# Patient Record
Sex: Male | Born: 1935 | Hispanic: No
Health system: Southern US, Community
[De-identification: ages and names within clinical notes are randomized; demographics above are authoritative.]

---

## 2003-08-27 ENCOUNTER — Other Ambulatory Visit: Payer: Self-pay

## 2003-11-15 ENCOUNTER — Other Ambulatory Visit: Payer: Self-pay

## 2003-11-15 ENCOUNTER — Inpatient Hospital Stay: Payer: Self-pay | Admitting: Internal Medicine

## 2004-09-16 ENCOUNTER — Ambulatory Visit: Payer: Self-pay | Admitting: Internal Medicine

## 2004-11-12 ENCOUNTER — Ambulatory Visit: Payer: Self-pay

## 2005-01-23 ENCOUNTER — Ambulatory Visit: Payer: Self-pay | Admitting: Internal Medicine

## 2006-05-16 ENCOUNTER — Other Ambulatory Visit: Payer: Self-pay

## 2006-05-16 ENCOUNTER — Emergency Department: Payer: Self-pay | Admitting: Emergency Medicine

## 2006-11-29 ENCOUNTER — Emergency Department: Payer: Self-pay | Admitting: Emergency Medicine

## 2007-12-04 ENCOUNTER — Emergency Department: Payer: Self-pay | Admitting: Unknown Physician Specialty

## 2007-12-10 ENCOUNTER — Emergency Department: Payer: Self-pay | Admitting: Emergency Medicine

## 2008-02-12 ENCOUNTER — Inpatient Hospital Stay: Payer: Self-pay | Admitting: Internal Medicine

## 2008-04-24 ENCOUNTER — Inpatient Hospital Stay: Payer: Self-pay | Admitting: Internal Medicine

## 2008-05-11 ENCOUNTER — Emergency Department: Payer: Self-pay | Admitting: Emergency Medicine

## 2009-02-12 ENCOUNTER — Emergency Department: Payer: Self-pay | Admitting: Unknown Physician Specialty

## 2009-03-14 ENCOUNTER — Inpatient Hospital Stay: Payer: Self-pay | Admitting: Orthopedic Surgery

## 2009-03-14 ENCOUNTER — Ambulatory Visit: Payer: Self-pay | Admitting: Internal Medicine

## 2009-03-19 ENCOUNTER — Encounter: Payer: Self-pay | Admitting: Internal Medicine

## 2009-03-20 ENCOUNTER — Encounter: Payer: Self-pay | Admitting: Internal Medicine

## 2009-03-27 ENCOUNTER — Inpatient Hospital Stay: Payer: Self-pay | Admitting: Internal Medicine

## 2010-01-02 ENCOUNTER — Emergency Department: Payer: Self-pay | Admitting: Emergency Medicine

## 2010-01-08 ENCOUNTER — Inpatient Hospital Stay: Payer: Self-pay | Admitting: Internal Medicine

## 2010-06-06 ENCOUNTER — Ambulatory Visit: Payer: Self-pay | Admitting: Cardiology

## 2010-06-11 ENCOUNTER — Ambulatory Visit: Payer: Self-pay | Admitting: Cardiology

## 2010-06-15 ENCOUNTER — Inpatient Hospital Stay: Payer: Self-pay | Admitting: Internal Medicine

## 2010-06-27 ENCOUNTER — Inpatient Hospital Stay: Payer: Self-pay | Admitting: Family Medicine

## 2010-07-04 ENCOUNTER — Emergency Department: Payer: Self-pay | Admitting: Emergency Medicine

## 2011-01-27 IMAGING — CR DG CHEST 1V PORT
1 series · 1 of 1 positions shown · non-contrast
Comparison: none

REASON FOR EXAM: low sats
COMMENTS:

[view not recorded]
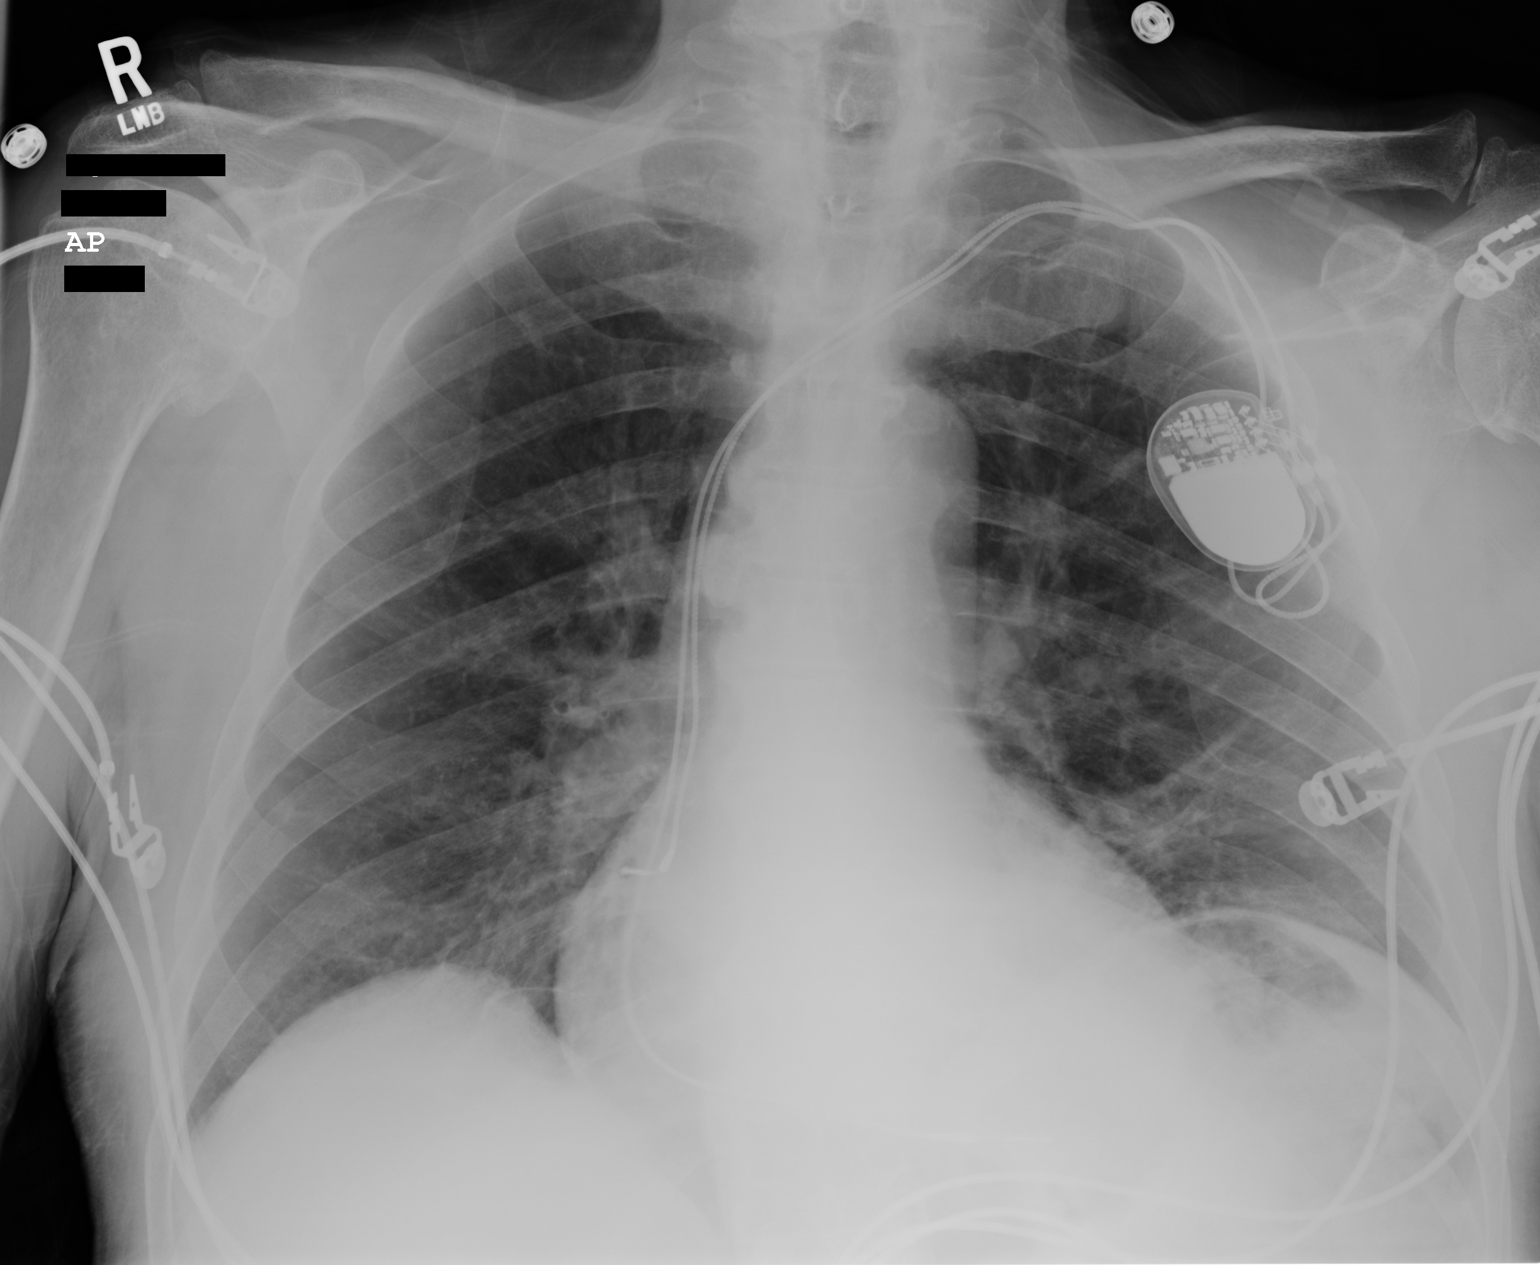

[1 of 1 positions shown; findings below may reference images not displayed]

PROCEDURE:     DXR - DXR PORTABLE CHEST SINGLE VIEW  - April 24, 2008  [DATE]

RESULT:     Comparison is made to the exam dated 02/18/2008. There is a
left-sided pacemaker present. The cardiac silhouette is at the upper limits
of normal. There is basilar fibrosis or atelectasis on the left. Infiltrate
is felt to be less likely an option. There is no edema, effusion or
pneumothorax.
IMPRESSION: Left lung base atelectasis versus fibrosis. Left-sided
pacemaker present.

## 2011-03-21 ENCOUNTER — Ambulatory Visit: Payer: Self-pay | Admitting: Internal Medicine

## 2011-03-29 ENCOUNTER — Emergency Department: Payer: Self-pay | Admitting: Internal Medicine

## 2011-04-09 ENCOUNTER — Inpatient Hospital Stay: Payer: Self-pay | Admitting: Family Medicine

## 2011-04-09 LAB — CBC
HCT: 34.2 % — ABNORMAL LOW (ref 40.0–52.0)
HGB: 11.1 g/dL — ABNORMAL LOW (ref 13.0–18.0)
MCH: 32.7 pg (ref 26.0–34.0)
MCHC: 32.5 g/dL (ref 32.0–36.0)
MCV: 101 fL — ABNORMAL HIGH (ref 80–100)
Platelet: 140 10*3/uL — ABNORMAL LOW (ref 150–440)
RBC: 3.4 10*6/uL — ABNORMAL LOW (ref 4.40–5.90)

## 2011-04-09 LAB — COMPREHENSIVE METABOLIC PANEL
Albumin: 3.1 g/dL — ABNORMAL LOW (ref 3.4–5.0)
Anion Gap: 10 (ref 7–16)
Bilirubin,Total: 0.4 mg/dL (ref 0.2–1.0)
Calcium, Total: 8.4 mg/dL — ABNORMAL LOW (ref 8.5–10.1)
Chloride: 101 mmol/L (ref 98–107)
Co2: 38 mmol/L — ABNORMAL HIGH (ref 21–32)
Creatinine: 0.72 mg/dL (ref 0.60–1.30)
EGFR (African American): >60
Glucose: 114 mg/dL — ABNORMAL HIGH (ref 65–99)
Osmolality: 297 (ref 275–301)
Potassium: 3.3 mmol/L — ABNORMAL LOW (ref 3.5–5.1)
Sodium: 149 mmol/L — ABNORMAL HIGH (ref 136–145)
Total Protein: 6.2 g/dL — ABNORMAL LOW (ref 6.4–8.2)

## 2011-04-09 LAB — PROTIME-INR
INR: 1.1
Prothrombin Time: 14.7 secs (ref 11.5–14.7)

## 2011-04-09 LAB — TROPONIN I: Troponin-I: 0.15 ng/mL — ABNORMAL HIGH

## 2011-04-10 LAB — CBC WITH DIFFERENTIAL/PLATELET
Basophil #: 0 10*3/uL (ref 0.0–0.1)
Basophil %: 0.5 %
Eosinophil #: 0.1 10*3/uL (ref 0.0–0.7)
Eosinophil %: 2.1 %
HGB: 12 g/dL — ABNORMAL LOW (ref 13.0–18.0)
Lymphocyte %: 11.6 %
MCH: 32.8 pg (ref 26.0–34.0)
MCV: 101 fL — ABNORMAL HIGH (ref 80–100)
Monocyte %: 5.7 %
Neutrophil #: 4.1 10*3/uL (ref 1.4–6.5)
Neutrophil %: 80.1 %
Platelet: 147 10*3/uL — ABNORMAL LOW (ref 150–440)
RBC: 3.67 10*6/uL — ABNORMAL LOW (ref 4.40–5.90)
WBC: 5.2 10*3/uL (ref 3.8–10.6)

## 2011-04-10 LAB — HEMOGLOBIN A1C: Hemoglobin A1C: 6.2 % (ref 4.2–6.3)

## 2011-04-10 LAB — TROPONIN I
Troponin-I: 0.18 ng/mL — ABNORMAL HIGH
Troponin-I: 0.16 ng/mL — ABNORMAL HIGH

## 2011-04-10 LAB — CK TOTAL AND CKMB (NOT AT ARMC)
CK, Total: 135 U/L (ref 35–232)
CK, Total: 101 U/L (ref 35–232)
CK-MB: 3.8 ng/mL — ABNORMAL HIGH (ref 0.5–3.6)
CK-MB: 3.1 ng/mL (ref 0.5–3.6)

## 2011-04-10 LAB — URINALYSIS, COMPLETE
Bilirubin,UR: NEGATIVE
Glucose,UR: NEGATIVE mg/dL (ref 0–75)
Hyaline Cast: 3
Nitrite: NEGATIVE
Ph: 5 (ref 4.5–8.0)
Protein: 30
RBC,UR: 8 /HPF (ref 0–5)
Specific Gravity: 1.017 (ref 1.003–1.030)
Squamous Epithelial: 1
WBC UR: 260 /HPF (ref 0–5)

## 2011-04-10 LAB — BASIC METABOLIC PANEL
Anion Gap: 12 (ref 7–16)
Calcium, Total: 8.1 mg/dL — ABNORMAL LOW (ref 8.5–10.1)
Chloride: 104 mmol/L (ref 98–107)
Co2: 33 mmol/L — ABNORMAL HIGH (ref 21–32)
Creatinine: 0.71 mg/dL (ref 0.60–1.30)
EGFR (African American): >60
Potassium: 2.8 mmol/L — ABNORMAL LOW (ref 3.5–5.1)

## 2011-04-10 LAB — LIPID PANEL
HDL Cholesterol: 52 mg/dL (ref 40–60)
Ldl Cholesterol, Calc: 75 mg/dL (ref 0–100)
VLDL Cholesterol, Calc: 9 mg/dL (ref 5–40)

## 2011-04-10 LAB — TSH: Thyroid Stimulating Horm: 2.83 u[IU]/mL

## 2011-04-10 LAB — MAGNESIUM: Magnesium: 1.6 mg/dL — ABNORMAL LOW

## 2011-04-11 LAB — BASIC METABOLIC PANEL
Anion Gap: 11 (ref 7–16)
BUN: 17 mg/dL (ref 7–18)
Creatinine: 0.87 mg/dL (ref 0.60–1.30)
EGFR (African American): >60
EGFR (Non-African Amer.): >60
Glucose: 104 mg/dL — ABNORMAL HIGH (ref 65–99)
Osmolality: 296 (ref 275–301)

## 2011-04-12 ENCOUNTER — Inpatient Hospital Stay: Payer: Self-pay | Admitting: Family Medicine

## 2011-04-12 DIAGNOSIS — R9431 Abnormal electrocardiogram [ECG] [EKG]: Secondary | ICD-10-CM

## 2011-04-12 LAB — COMPREHENSIVE METABOLIC PANEL
Alkaline Phosphatase: 115 U/L (ref 50–136)
Bilirubin,Total: 0.4 mg/dL (ref 0.2–1.0)
Chloride: 106 mmol/L (ref 98–107)
Glucose: 160 mg/dL — ABNORMAL HIGH (ref 65–99)
SGPT (ALT): 37 U/L
Sodium: 145 mmol/L (ref 136–145)
Total Protein: 5.6 g/dL — ABNORMAL LOW (ref 6.4–8.2)

## 2011-04-12 LAB — CBC WITH DIFFERENTIAL/PLATELET
Basophil %: 0.3 %
Eosinophil %: 2.2 %
HCT: 36.3 % — ABNORMAL LOW (ref 40.0–52.0)
HGB: 11.8 g/dL — ABNORMAL LOW (ref 13.0–18.0)
Lymphocyte #: 0.6 10*3/uL — ABNORMAL LOW (ref 1.0–3.6)
Lymphocyte %: 12.1 %
MCHC: 32.6 g/dL (ref 32.0–36.0)
MCV: 101 fL — ABNORMAL HIGH (ref 80–100)
Monocyte #: 0.2 10*3/uL (ref 0.0–0.7)
Monocyte %: 3.9 %
Neutrophil #: 4.3 10*3/uL (ref 1.4–6.5)
WBC: 5.3 10*3/uL (ref 3.8–10.6)

## 2011-04-12 LAB — URINALYSIS, COMPLETE
Glucose,UR: NEGATIVE mg/dL (ref 0–75)
Nitrite: NEGATIVE
Protein: NEGATIVE
RBC,UR: 37 /HPF (ref 0–5)
Squamous Epithelial: NONE SEEN
WBC UR: 558 /HPF (ref 0–5)

## 2011-04-12 LAB — PROTIME-INR
INR: 1.1
Prothrombin Time: 14.2 secs (ref 11.5–14.7)

## 2011-04-12 LAB — TROPONIN I: Troponin-I: 0.12 ng/mL — ABNORMAL HIGH

## 2011-04-13 LAB — CBC WITH DIFFERENTIAL/PLATELET
Basophil %: 0 %
Eosinophil #: 0 10*3/uL (ref 0.0–0.7)
Eosinophil %: 0 %
HGB: 11.5 g/dL — ABNORMAL LOW (ref 13.0–18.0)
Lymphocyte #: 0.2 10*3/uL — ABNORMAL LOW (ref 1.0–3.6)
Lymphocyte %: 4.6 %
MCH: 33.2 pg (ref 26.0–34.0)
MCHC: 33.3 g/dL (ref 32.0–36.0)
MCV: 100 fL (ref 80–100)
Monocyte #: 0.1 10*3/uL (ref 0.0–0.7)
Neutrophil %: 93.9 %
Platelet: 143 10*3/uL — ABNORMAL LOW (ref 150–440)
RBC: 3.47 10*6/uL — ABNORMAL LOW (ref 4.40–5.90)

## 2011-04-13 LAB — BASIC METABOLIC PANEL
Anion Gap: 15 (ref 7–16)
Chloride: 108 mmol/L — ABNORMAL HIGH (ref 98–107)
Co2: 27 mmol/L (ref 21–32)
Creatinine: 0.93 mg/dL (ref 0.60–1.30)
EGFR (African American): >60
Osmolality: 302 (ref 275–301)
Sodium: 150 mmol/L — ABNORMAL HIGH (ref 136–145)

## 2011-04-14 LAB — CBC WITH DIFFERENTIAL/PLATELET
Basophil #: 0 10*3/uL (ref 0.0–0.1)
Basophil %: 0.1 %
Eosinophil #: 0 10*3/uL (ref 0.0–0.7)
HCT: 40.4 % (ref 40.0–52.0)
HGB: 13.4 g/dL (ref 13.0–18.0)
Lymphocyte %: 3.6 %
MCH: 33 pg (ref 26.0–34.0)
MCHC: 33.2 g/dL (ref 32.0–36.0)
Monocyte #: 0.4 10*3/uL (ref 0.0–0.7)
Monocyte %: 3.4 %
Neutrophil %: 92.9 %
RDW: 14.8 % — ABNORMAL HIGH (ref 11.5–14.5)
WBC: 11.4 10*3/uL — ABNORMAL HIGH (ref 3.8–10.6)

## 2011-04-14 LAB — BASIC METABOLIC PANEL
Anion Gap: 15 (ref 7–16)
BUN: 28 mg/dL — ABNORMAL HIGH (ref 7–18)
Calcium, Total: 8.3 mg/dL — ABNORMAL LOW (ref 8.5–10.1)
Co2: 27 mmol/L (ref 21–32)
EGFR (African American): >60
EGFR (Non-African Amer.): >60
Glucose: 178 mg/dL — ABNORMAL HIGH (ref 65–99)
Potassium: 3.3 mmol/L — ABNORMAL LOW (ref 3.5–5.1)
Sodium: 148 mmol/L — ABNORMAL HIGH (ref 136–145)

## 2011-04-14 LAB — DRUG SCREEN, URINE
Amphetamines, Ur Screen: NEGATIVE (ref ?–1000)
Barbiturates, Ur Screen: NEGATIVE (ref ?–200)
Benzodiazepine, Ur Scrn: POSITIVE (ref ?–200)
Cannabinoid 50 Ng, Ur ~~LOC~~: NEGATIVE (ref ?–50)
MDMA (Ecstasy)Ur Screen: NEGATIVE (ref ?–500)
Methadone, Ur Screen: NEGATIVE (ref ?–300)
Opiate, Ur Screen: NEGATIVE (ref ?–300)
Tricyclic, Ur Screen: NEGATIVE (ref ?–1000)

## 2011-04-15 LAB — MAGNESIUM: Magnesium: 1.9 mg/dL

## 2011-04-16 LAB — CBC WITH DIFFERENTIAL/PLATELET
Basophil #: 0 10*3/uL (ref 0.0–0.1)
Eosinophil #: 0 10*3/uL (ref 0.0–0.7)
Eosinophil %: 0 %
HGB: 11.9 g/dL — ABNORMAL LOW (ref 13.0–18.0)
Lymphocyte #: 0.6 10*3/uL — ABNORMAL LOW (ref 1.0–3.6)
Lymphocyte %: 8 %
MCH: 33 pg (ref 26.0–34.0)
MCV: 101 fL — ABNORMAL HIGH (ref 80–100)
Neutrophil %: 85.7 %
Platelet: 136 10*3/uL — ABNORMAL LOW (ref 150–440)
RBC: 3.61 10*6/uL — ABNORMAL LOW (ref 4.40–5.90)
RDW: 15.2 % — ABNORMAL HIGH (ref 11.5–14.5)
WBC: 6.9 10*3/uL (ref 3.8–10.6)

## 2011-04-16 LAB — BASIC METABOLIC PANEL
Anion Gap: 10 (ref 7–16)
BUN: 24 mg/dL — ABNORMAL HIGH (ref 7–18)
Calcium, Total: 8.1 mg/dL — ABNORMAL LOW (ref 8.5–10.1)
Co2: 28 mmol/L (ref 21–32)
Creatinine: 0.85 mg/dL (ref 0.60–1.30)
EGFR (African American): >60
EGFR (Non-African Amer.): >60
Glucose: 90 mg/dL (ref 65–99)
Osmolality: 303 (ref 275–301)
Sodium: 151 mmol/L — ABNORMAL HIGH (ref 136–145)

## 2011-04-17 LAB — CULTURE, BLOOD (SINGLE)

## 2011-04-18 ENCOUNTER — Ambulatory Visit: Payer: Self-pay | Admitting: Internal Medicine

## 2011-05-19 DEATH — deceased

## 2011-11-17 IMAGING — CR DG CHEST 1V PORT
1 series · 2 of 2 positions shown · non-contrast
Comparison: none

REASON FOR EXAM: sob
COMMENTS:

[Series 1: view not recorded · 0.17mm/px · 2 of 2 slices shown]
[im 1/2]
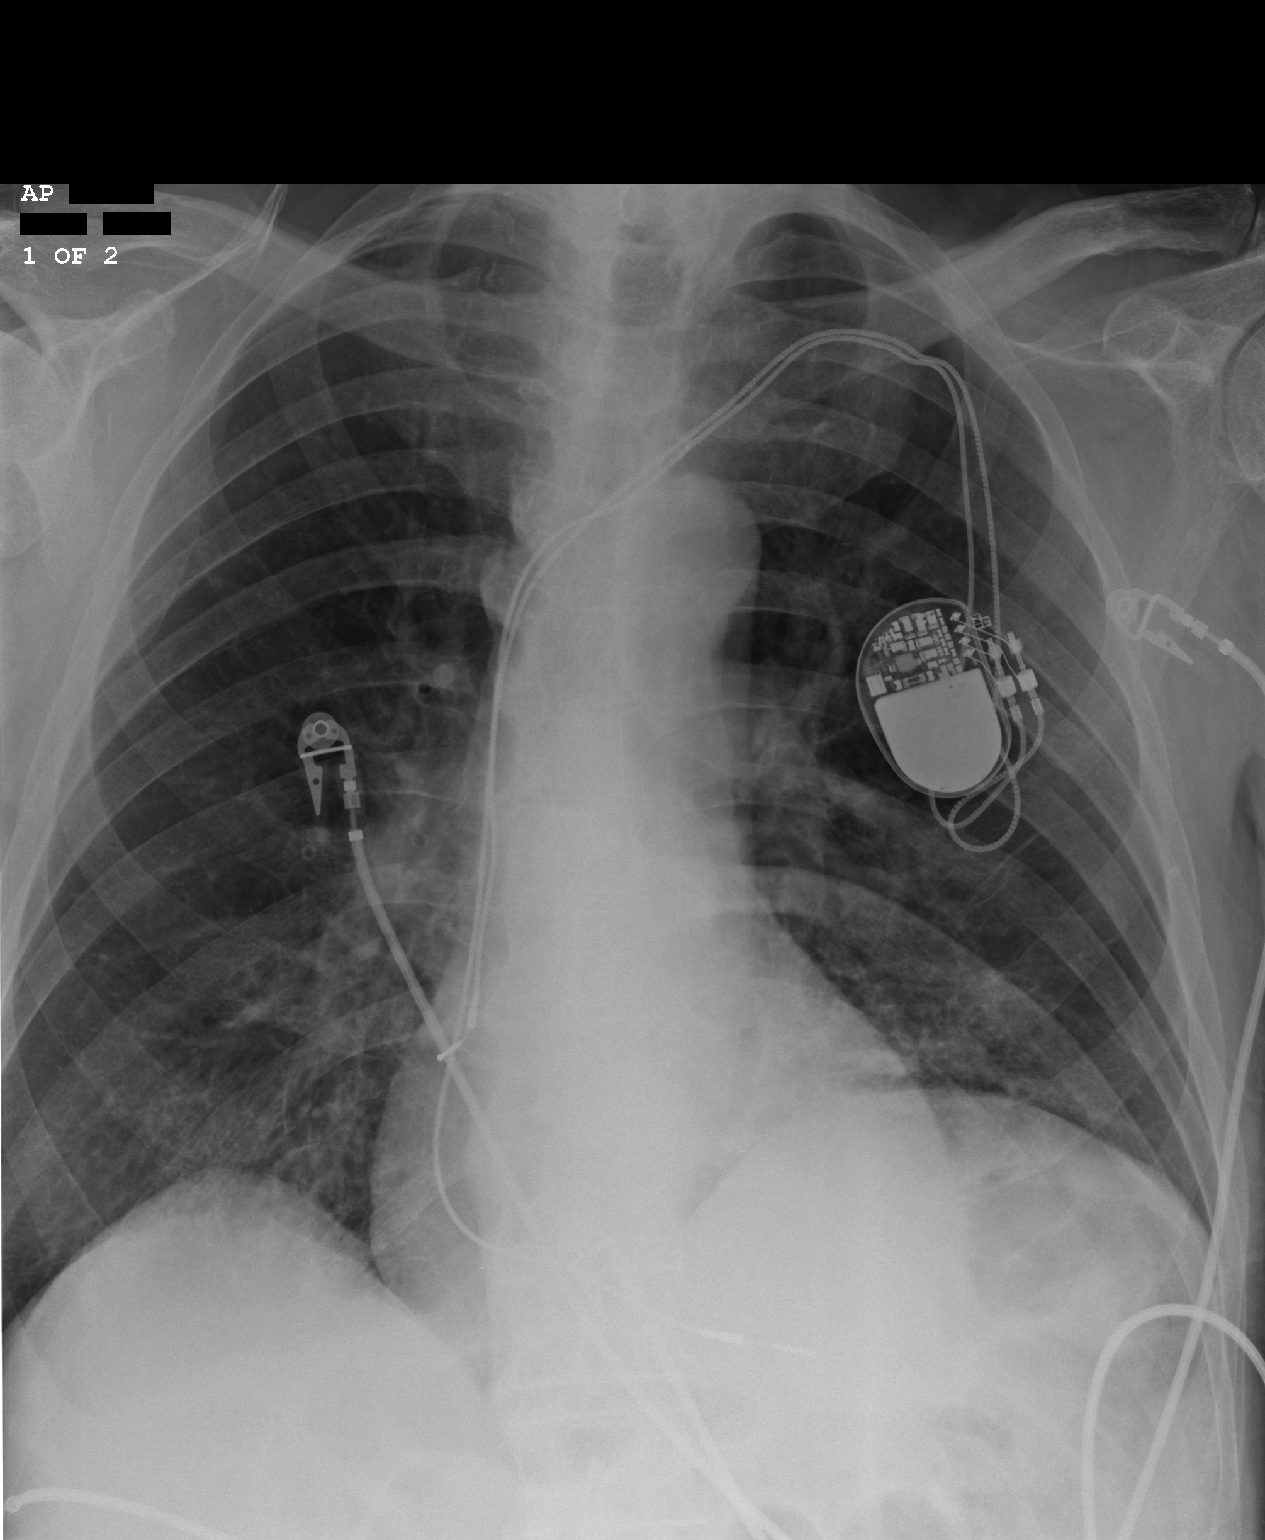
[im 2/2]
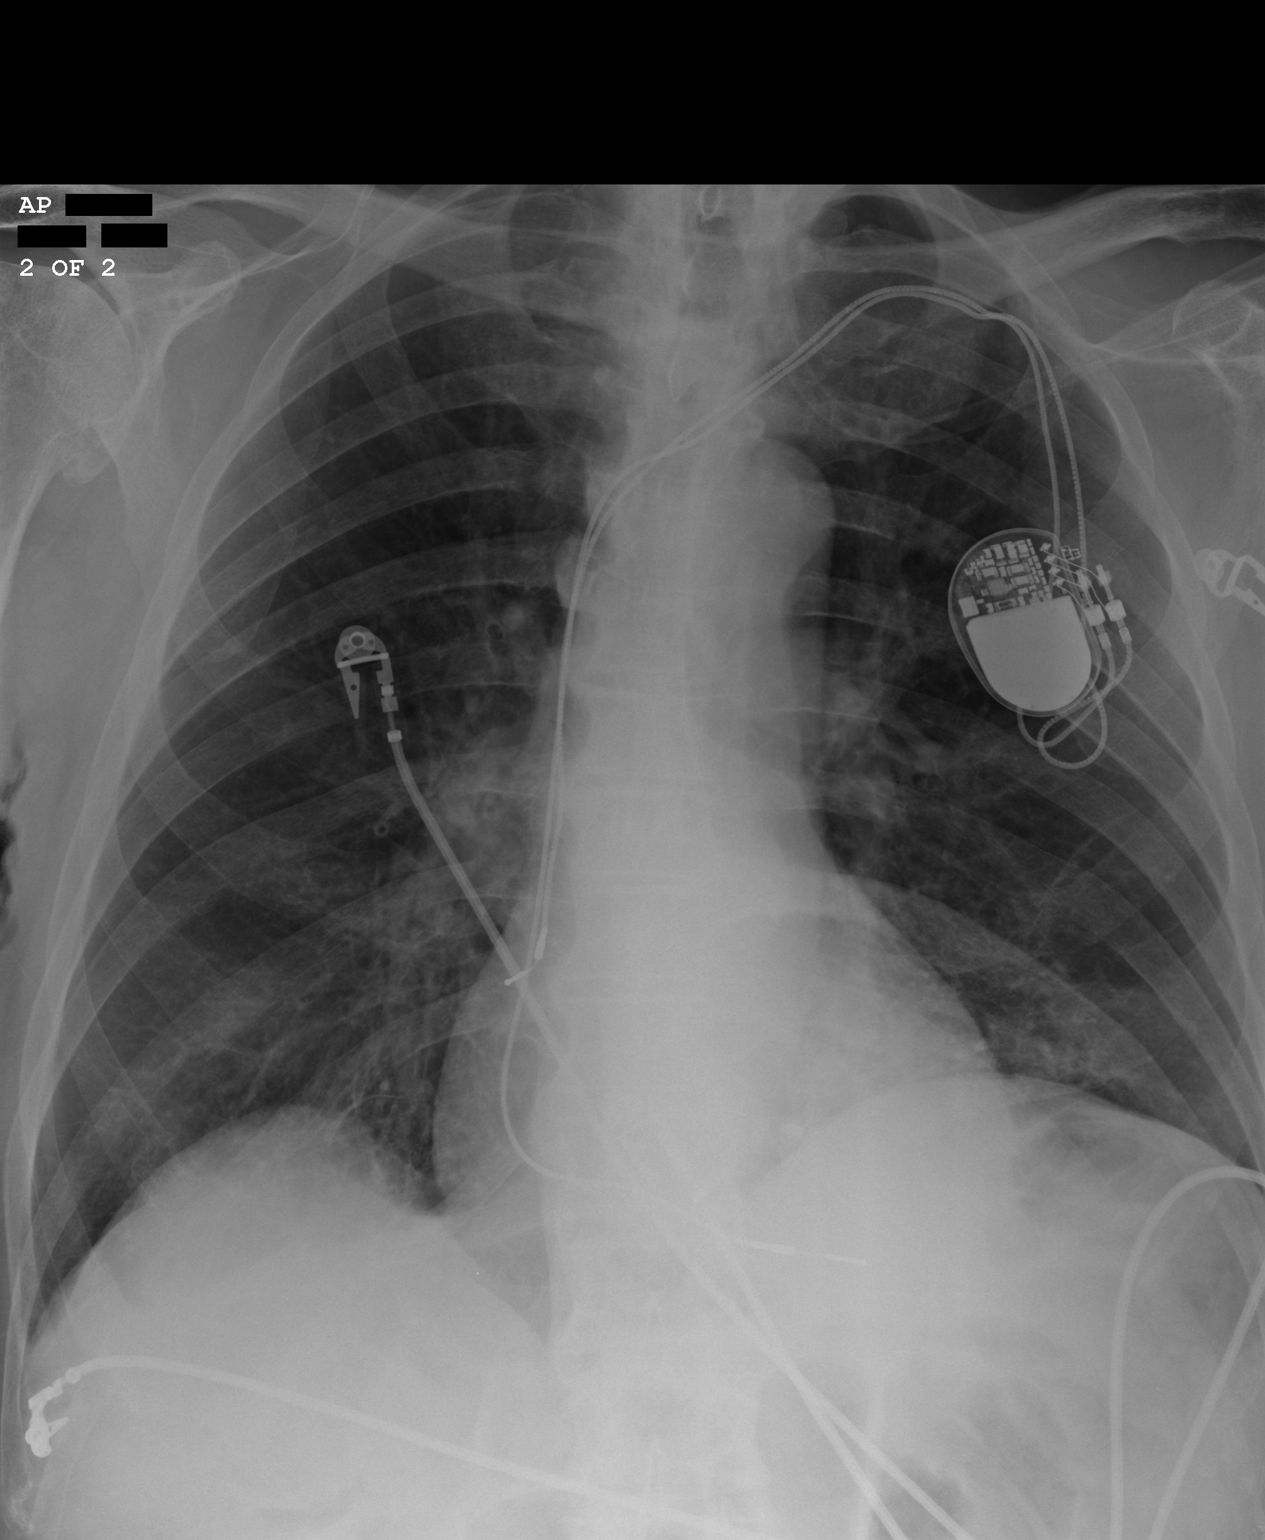

[2 of 2 positions shown; findings below may reference images not displayed]

PROCEDURE:     DXR - DXR PORTABLE CHEST SINGLE VIEW  - February 12, 2009  [DATE]

RESULT:     Comparison is made to the study of 04/24/2008.

There is a left-sided pacemaker present. The lungs are hyperinflated
consistent with COPD. There is no definite infiltrate, edema, effusion or
pneumothorax. Some minimal left lung base atelectasis appears present.
IMPRESSION: 1.     Pacemaker present.
2.     Minimal basilar atelectasis.

## 2014-01-14 IMAGING — CT CT CHEST W/ CM
1 series · 14 of 33 positions shown, 18 images · IV contrast (APPLIED)
Comparison: none

REASON FOR EXAM: acute respiratory failure R/O PE
COMMENTS:   LMP: (Male)

PROCEDURE:     CT  - CT CHEST WITH CONTRAST  - April 12, 2011  [DATE]
RESULT:     Comparison: 03/27/2009
TECHNIQUE: Multiple thin section axial images were obtained from the lung
apices to the upper abdomen following 75 ml Isovue 370 intravenous contrast,
according to the PE protocol. These images were also reviewed on a Siemens
multiplanar work station.

[Series 4: soft tissue · axial · 0.85mm/px · z∈[-437,-116]mm · 14 of 127 slices shown, 18 images]
[im 10/127  mediastinal]
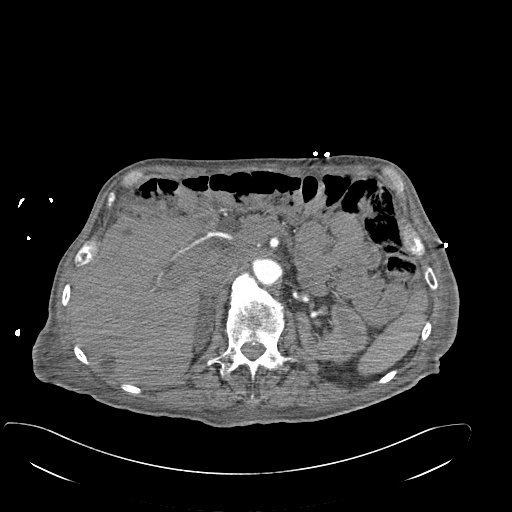
[im 10/127  lung]
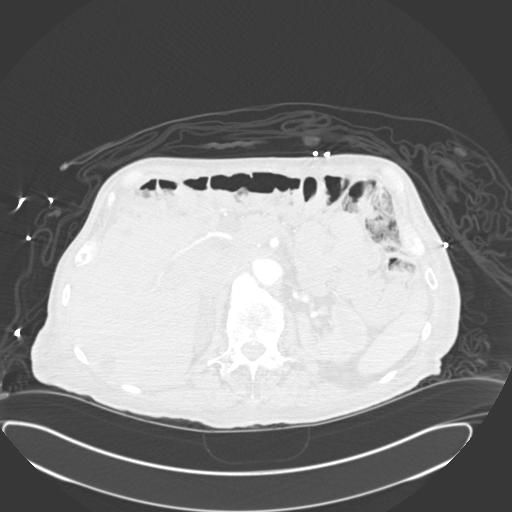
[im 19/127  lung]
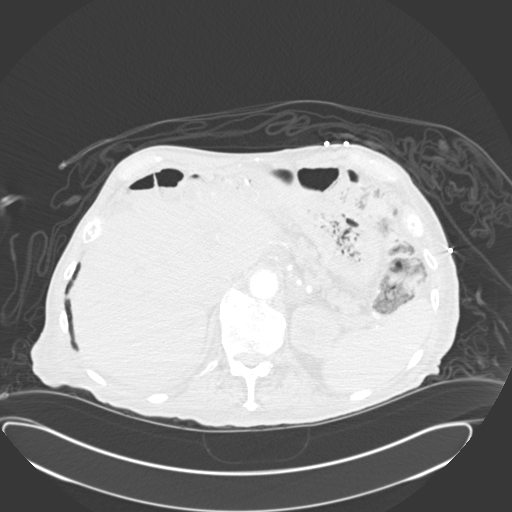
[im 26/127  lung]
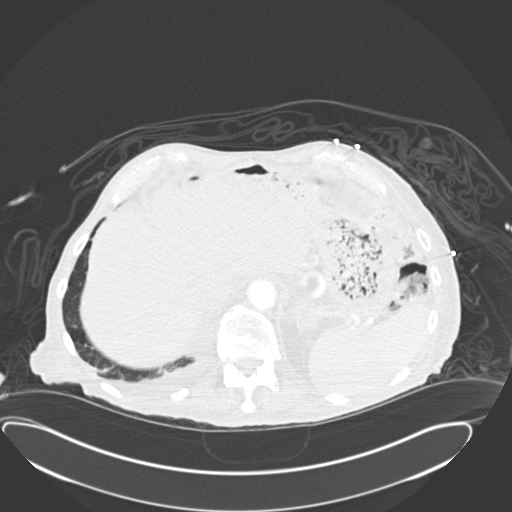
[im 33/127  lung]
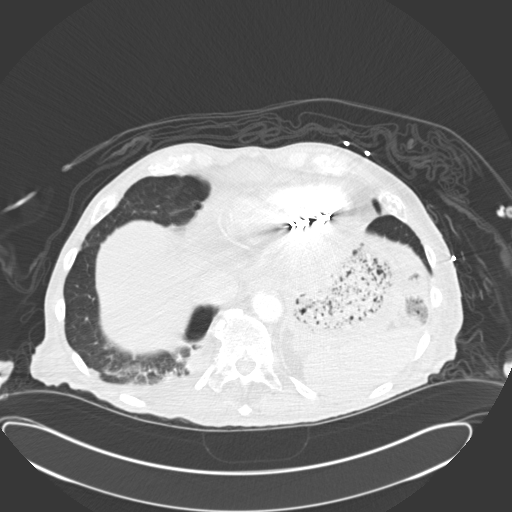
[im 43/127  mediastinal]
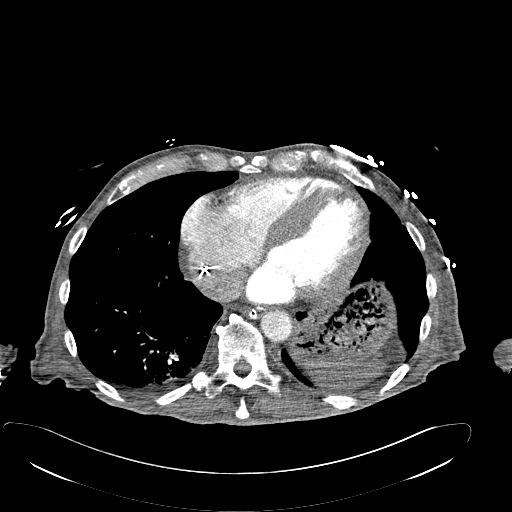
[im 43/127  lung]
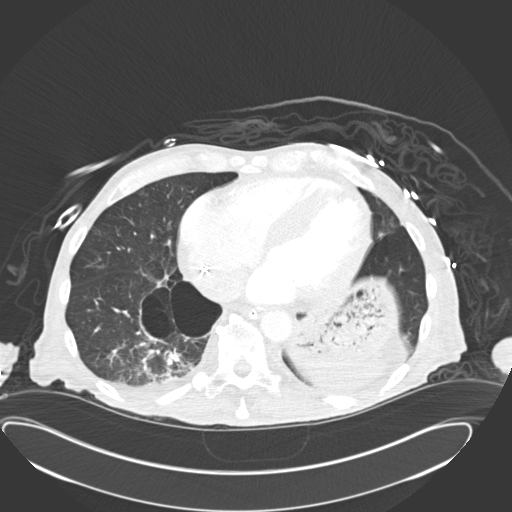
[im 52/127  lung]
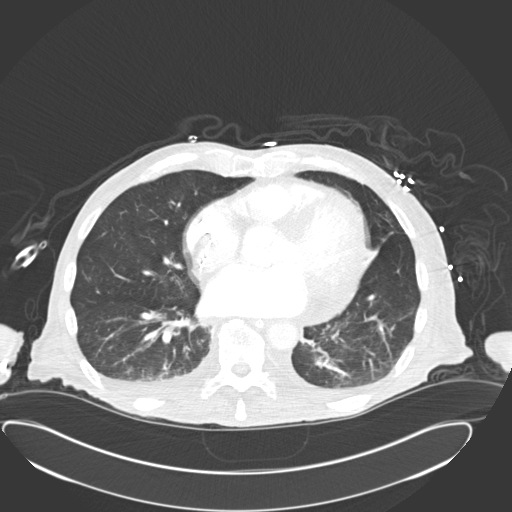
[im 61/127  lung]
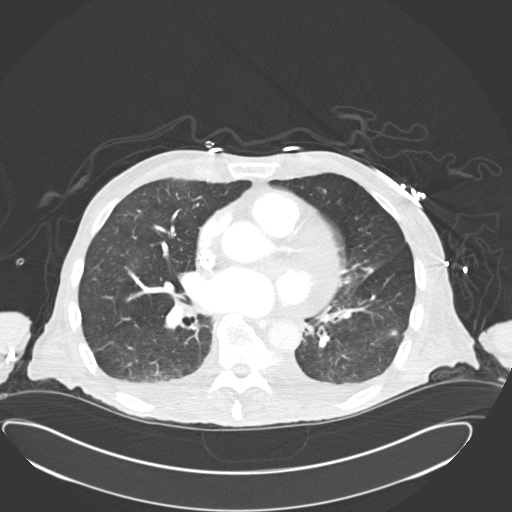
[im 67/127  lung]
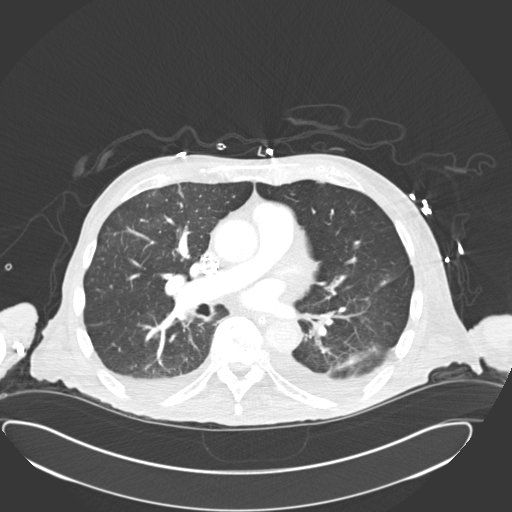
[im 75/127  mediastinal]
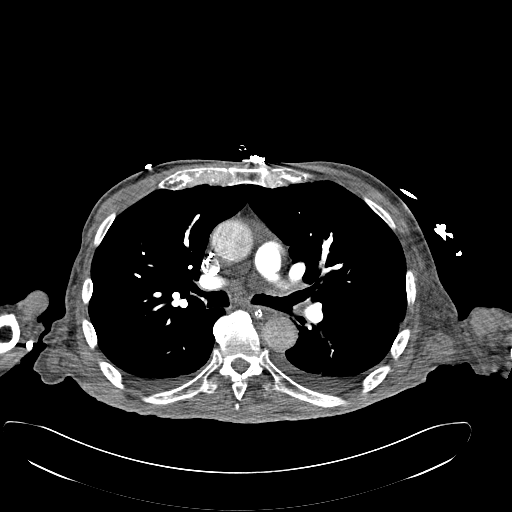
[im 75/127  lung]
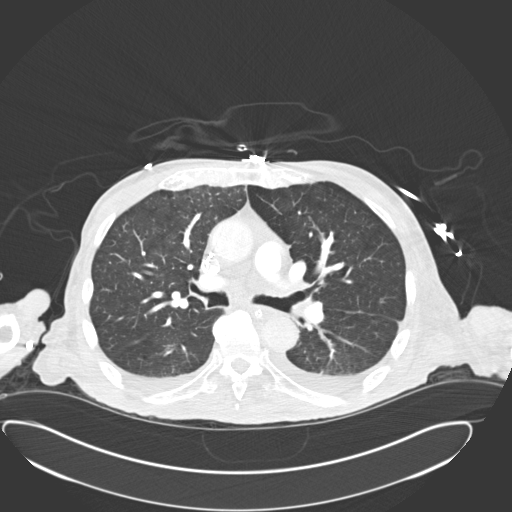
[im 85/127  lung]
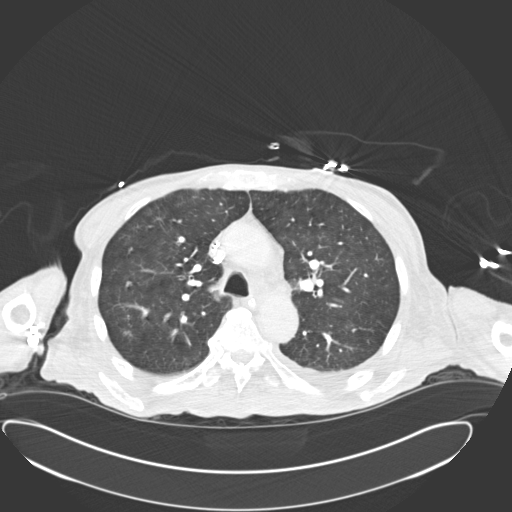
[im 94/127  lung]
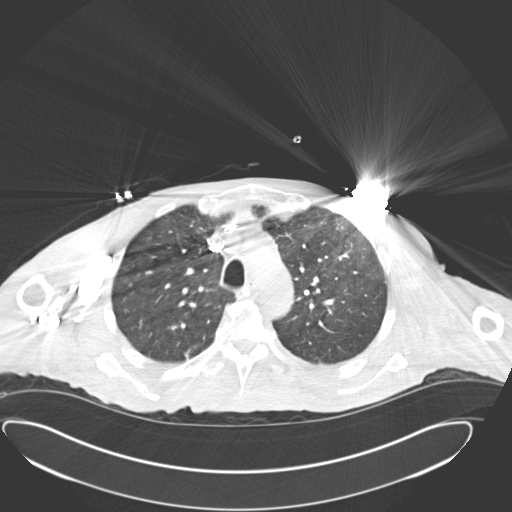
[im 101/127  lung]
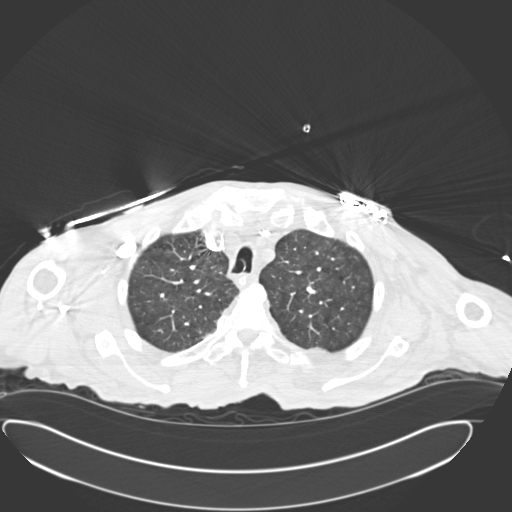
[im 108/127  mediastinal]
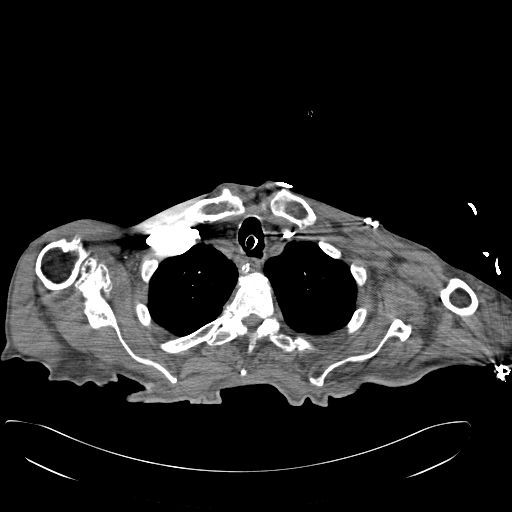
[im 108/127  lung]
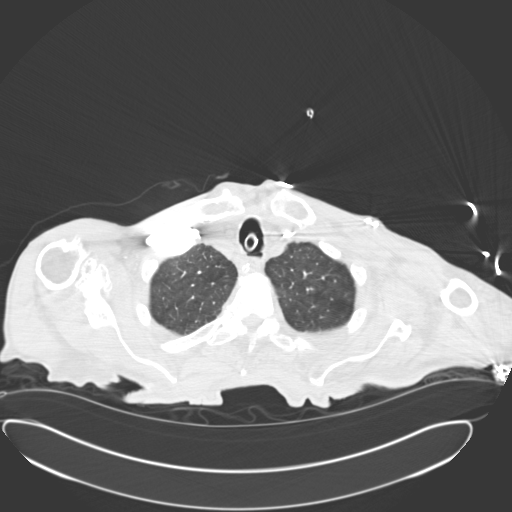
[im 117/127  lung]
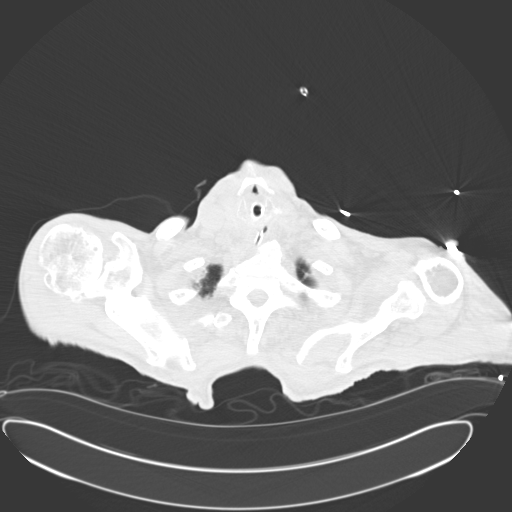

[14 of 33 positions shown; findings below may reference images not displayed]

FINDINGS: The endotracheal tube terminates in the superior intrathoracic trachea. No
mediastinal, hilar, or axillary lymphadenopathy. Calcifications are seen in
the coronary arteries. Small low-attenuation focus in the right hepatic lobe
is too small to characterize. Low-attenuation lesion in the posterior right
hepatic lobe likely represents a cyst. A rounded low-attenuation lesion in
the inferior right hepatic lobe is incompletely visualized on this study,
but consistent with a cyst on the prior the study.

There is a small aneurysm of the celiac artery measuring 12 mm in diameter.
This is similar to prior. The thoracic aorta is normal in caliber. No
pulmonary embolus identified. Evaluation of the segmental pulmonary arteries
is limited by respiratory motion.

There is a small bulla at the right lung base. There is a trace right
pleural effusion. Mild basilar dependent opacities are likely secondary to
atelectasis. There is a subpleural nodular density in the left upper lobe
which measures 1.2 x 0.7 cm. There are mild groundglass opacities in the
upper lobes which are nonspecific but may be related to pulmonary edema.
There is a 4 mm nodule at the left upper lobe, image 21.

No aggressive lytic or sclerotic osseous lesions are identified.
IMPRESSION: 1. No pulmonary embolus identified. Evaluation of the segmental pulmonary
arteries is limited.
2. Subtle groundglass opacities in the upper lobes are nonspecific. These
may be secondary to pulmonary edema. A nonspecific alveolitis is a
differential consideration.
3. Indeterminate subpleural nodular density in the left upper lobe. A
followup noncontrast chest CT is recommended in 3 months.
4. Indeterminate 4 mm nodule in the left upper lobe. Recommend attention on
the aforementioned followup chest CT.
5. Small, 12 mm aneurysm of the celiac artery, similar to prior.

## 2014-06-11 NOTE — Consult Note (Signed)
Present Illness 81 year old male with known cardiovascular disease with chronic atrial fibrillation with sick sinus syndrome status post pacemaker placement with recent pacemaker interrogation showing no evidence of need in reprogramming, or other rhythm disturbances.  The patient also has diastolic dysfunction, congestive heart failure treated with Lasix.  The patient has hypertension but has had an episode of dizziness and somewhat hypotension on arrival.  He currently has not had any f atypical type dizziness.  The patient also has diabetes mellitus which could be affecting some of his unsteadiness as well.  The patient has had an elevated troponi.  The patient also has an EKG today showing atrial fibrillation with controlled ventricular rate with pacemaker, ventricular rate.  The patient has had no further significant symptoms an  Family history No family members with early onset of cardiovascular disease  Social history Patient currently denies alcohol or tobacco use   Physical Exam:   GEN WD    HEENT pink conjunctivae    NECK No masses    RESP normal resp effort  crackles    CARD Regular rate and rhythm  Murmur    Murmur Systolic    Systolic Murmur axilla    ABD denies tenderness  soft    LYMPH negative neck    EXTR negative cyanosis/clubbing    SKIN No rashes    NEURO cranial nerves intact    PSYCH alert   Review of Systems:   Subjective/Chief Complaint I got dizzy    Review of Systems: All other systems were reviewed and found to be negative    Medications/Allergies Reviewed Medications/Allergies reviewed     PUD - Peptic Ulcer Disease:    BPH - Benign Prostatic Hypertrophy:    Hard of Hearing:    Kidney Stones:    Coronary Artery Disease:    Cataracts:    Ulcers, Gastric:    Hypothyroidism:    COPD:    Asthma:    Pacemaker:    ORIF of the left hip: 15-Mar-2009   stomach - related to gastic ulcers:    Hernia Repair:   Home  Medications: Medication Instructions Status  Proventil HFA 90 mcg/inh inhalation aerosol with adapter 1  inhaled every 4 hours as needed   Active  carbamazepine 200 mg oral tablet 1  orally 2 times a day  Active  ferrous sulfate 325 mg (65 mg elemental iron) oral tablet 1  orally 2 times a day  Active  fludrocortisone 0.1 mg oral tablet 1  orally once a day  Active  Levothroid 112 mcg (0.112 mg) oral tablet 1  orally once a day  Active  acetaminophen 650 mg oral tablet, extended release 1  orally every 6 hours as needed   Active  Lasix 20 mg oral tablet 1 tab(s) orally once a day Active  paroxetine 20 mg oral tablet 1 tab(s) orally once a day Active  Advair Diskus 250 mcg-50 mcg inhalation powder 1 puff(s) inhaled 2 times a day Active  Spiriva 18 mcg inhalation capsule 1 each inhaled once a day Active   Routine Hem:  20-Feb-13 19:17    WBC (CBC) 5.7   RBC (CBC) 3.40   Hemoglobin (CBC) 11.1   Hematocrit (CBC) 34.2   Platelet Count (CBC) 140   MCV 101   MCH 32.7   MCHC 32.5   RDW 15.1  Routine Chem:  20-Feb-13 19:17    Glucose, Serum 114   BUN 14   Creatinine (comp) 0.72   Sodium, Serum  149   Potassium, Serum 3.3   Chloride, Serum 101   CO2, Serum 38   Calcium (Total), Serum 8.4  Hepatic:  20-Feb-13 19:17    Bilirubin, Total 0.4   Alkaline Phosphatase 132   SGPT (ALT) 30   SGOT (AST) 39   Total Protein, Serum 6.2   Albumin, Serum 3.1  Routine Chem:  20-Feb-13 19:17    Osmolality (calc) 297   eGFR (African American) >60   eGFR (Non-African American) >60   Anion Gap 10  Cardiac:  20-Feb-13 19:17    Troponin I 0.15  Routine Coag:  20-Feb-13 19:17    Activated PTT (APTT) 32.4   Prothrombin 14.7   INR 1.1  Cardiac:  20-Feb-13 19:17    CK, Total 135   CPK-MB, Serum 3.8  Routine UA:  20-Feb-13 23:52    Color (UA) Amber   Clarity (UA) Cloudy   Glucose (UA) Negative   Bilirubin (UA) Negative   Ketones (UA) Negative   Specific Gravity (UA) 1.017   Blood (UA) 1+    pH (UA) 5.0   Protein (UA) 30 mg/dL   Nitrite (UA) Negative   Leukocyte Esterase (UA) 3+   RBC (UA) 8 /HPF   WBC (UA) 260 /HPF   Bacteria (UA) 3+   Epithelial Cells (UA) 1 /HPF   WBC Clump (UA) PRESENT   Hyaline Cast (UA) 3 /LPF  Routine Hem:  21-Feb-13 03:28    WBC (CBC) 5.2   RBC (CBC) 3.67   Hemoglobin (CBC) 12.0   Hematocrit (CBC) 36.9   Platelet Count (CBC) 147   MCV 101   MCH 32.8   MCHC 32.6   RDW 14.8  Routine Chem:  21-Feb-13 03:28    Glucose, Serum 101   BUN 15   Creatinine (comp) 0.71   Sodium, Serum 149   Potassium, Serum 2.8   Chloride, Serum 104   CO2, Serum 33   Calcium (Total), Serum 8.1   Osmolality (calc) 297   eGFR (African American) >60   eGFR (Non-African American) >60   Anion Gap 12  Cardiac:  21-Feb-13 03:28    Troponin I 0.18   CK, Total 89   CPK-MB, Serum 3.1  Routine Hem:  21-Feb-13 03:28    Neutrophil % 80.1   Lymphocyte % 11.6   Monocyte % 5.7   Eosinophil % 2.1   Basophil % 0.5   Neutrophil # 4.1   Lymphocyte # 0.6   Monocyte # 0.3   Eosinophil # 0.1   Basophil # 0.0  Routine Chem:  21-Feb-13 03:28    Magnesium, Serum 1.6   Hemoglobin A1c ([facility]) 6.2   Cholesterol, Serum 136   Triglycerides, Serum 45   HDL ([facility]) 52   VLDL Cholesterol Calculated 9   LDL Cholesterol Calculated 75  Thyroid:  21-Feb-13 03:28    Thyroid Stimulating Hormone 2.83   EKG:   EKG Interp. by me    Interpretation atrial fibrillation with ventricular pacing    Other- Explain in Comments Line: GI Distress  Codeine: GI Distress  ASA: Other  Dilantin: Unknown  Vital Signs/Nurse's Notes: **Vital Signs.:   21-Feb-13 01:30   Vital Signs Type Admission   Temperature Temperature (F) 97.4   Celsius 36.3   Temperature Source oral   Pulse Pulse 60   Pulse source per Dinamap   Respirations Respirations 20   Systolic BP Systolic BP 139   Diastolic BP (mmHg) Diastolic BP (mmHg) 74   Mean BP 95  BP Source Dinamap   Pulse Ox % Pulse Ox %  96   Pulse Ox Activity Level  At rest   Oxygen Delivery 2L     Impression 81 year old male with known chronic diastolic dysfunction, congestive heart failure, valvular heart disease, chronic atrial fibrillation, status post pacemaker placement, hypertension, diabetes mellitus, chronic obstructive pulmonary disease with acute dizziness and hypoxemia with hypotension consistent with COPD exacerbation and elevated troponin consistent with demand ischemia and no current evidence of myocardial infarction    Plan 1.  Continue to monitor for next 3-5 hours for worsening rhythm disturbances while patient ambulates 2.  Assessment of hypotension with ambulation or orthostatic hypotension, requiring adjustments of medications. 3.  No further cardiac diagnostics necessary at this time due to no evidence of significant congestive heart failure. 4.  No additional treatment of minimal elevation of troponin due to demand ischemia and no evidence of myocardial infarction. 5.  Pacemaker interrogation likely at outpatient for reevaluation and adjustments as necessary 6.  Further treatment options after ambulation and possible adjustments of medications   Electronic Signatures: Corey Skains (MD)  (Signed 21-Feb-13 08:46)  Authored: General Aspect/Present Illness, History and Physical Exam, Review of System, Past Medical History, Home Medications, Labs, EKG , Allergies, Vital Signs/Nurse's Notes, Impression/Plan   Last Updated: 21-Feb-13 08:46 by Corey Skains (MD)

## 2014-06-11 NOTE — H&P (Signed)
PATIENT NAME:  Jeff Krause, LOFGREN MR#:  [id number] DATE OF BIRTH:  Dec 17, 1921  DATE OF ADMISSION:  04/12/2011  PRIMARY CARE PHYSICIAN:  Dr. Burnadette Pop. PRIMARY CARDIOLOGIST: Dr. Lady Gary. PRIMARY PULMONOLOGIST: Dr. Meredeth Ide.   CHIEF COMPLAINT: Agonal breathing and decreased responsiveness.   HISTORY OF PRESENT ILLNESS: Mr. Jeff Krause is an 81 year old Caucasian male with history of severe chronic obstructive pulmonary disease and pulmonary fibrosis who was just discharged yesterday after treatment of respiratory distress and stage IV chronic obstructive pulmonary disease. His girlfriend reports that early in the morning today she found him beside the bed and it appeared that he needs help. She called EMS and the patient was transported to the Emergency Department. Here, he was found to be in agonal breathing and decreased responsiveness. The patient was intubated and connected to a ventilator. He had a CT scan of the head without contrast that was negative and he had chest x-ray that does not show any new changes. His troponin is mildly elevated, but this is the case a few days ago and he had cardiology consultation with Dr. Gwen Pounds and decided that the troponin elevation was demand ischemia and no further action is required.   REVIEW OF SYSTEMS: Unobtainable due to the patient's condition being on ventilator and unresponsive.   PAST MEDICAL HISTORY:  1. History of chronic obstructive pulmonary disease and pulmonary fibrosis, on oxygen supplementation chronically, using 4.5 liters and he is followed up by his pulmonologist, Dr. Meredeth Ide.  2. History of systemic hypertension.  3. History of orthostatic hypotension.  4. History of seizure disorder.  5. History of hypothyroidism.  6. History of sick sinus syndrome status post pacemaker insertion and also status post generator exchange in April 2012.  7. He also has history of atrial fibrillation and he is considered not a candidate for anticoagulation.   8. History of Parkinsonism.  9. History of seizure disorder. 10. Diabetes mellitus type 2.  11. History of depression.  12. History of iron deficiency anemia.  13. History of diastolic dysfunction, congestive heart failure. His echocardiogram in May 2012 showed ejection fraction of 50 to 55%.   PAST SURGICAL HISTORY:  1. History of inguinal hernia repair.  2. History of transurethral resection of the prostate.  3. History of abdominal surgery for bleeding peptic ulcer disease.  4. History of pacemaker placement and generator exchange.   ADMISSION MEDICATIONS: The patient was discharged on the following medications: 1. Proventil HFA inhaler. 2. Advair Diskus 250/50, one puff twice a day. 3. Spiriva 1 inhalation once a day. 4. Carbamazepine or Tegretol 200 mg twice a day.  5. Ferrous sulfate 325 mg twice a day.  6. Fludrocortisone 0.1 mg once a day. 7. Acetaminophen 650 mg every six hours p.r.n.  8. Lasix 20 mg once a day.  9. Also, it was missing the Synthroid. He is supposed to be on 100 mcg once a day.   FAMILY HISTORY: Per old records, his mother died from stroke and his father had prostate cancer.   SOCIAL HISTORY: The patient is retired. He lives at home with his girlfriend.   SOCIAL HABITS: Remote history of tobacco abuse. He quit in 1960. No history of alcohol or drug abuse.   ALLERGIES: Codeine, Dilantin and aspirin.   PHYSICAL EXAMINATION:  VITAL SIGNS: Blood pressure 120/70, respiratory rate 16, pulse 60. Oxygen saturation 100% and this is on oxygen supplementation on ventilator. Chest x-ray revealed no consolidation. No effusion. Endotracheal tube is in satisfactory position. EKG showed electronic ventricular pacing.  Right is 60 per minute. Arterial blood gas after intubation showed a pH of 7.42, pCO2 49, pO2 59 and that was on FiO2 of 70%. CBC showed white count 5,000, hemoglobin 11.8, hematocrit 36, platelet count 175. Glucose 160, BUN 18, creatinine 1.06, sodium 145,  potassium 4.2, calcium 7.7. However, his albumin is low at 2.6, AST 52 and ALT 37. Troponin is elevated at 0.12. However, his troponin on 02/21 was 0.16. Urinalysis showed significant pyuria with white blood cells reaching 558 and +3 leukocyte esterase and +1 bacteria.   ASSESSMENT:  1. Acute respiratory failure. This is either progression of his chronic respiratory failure, however, we need to rule out an acute event like pulmonary embolism. Right now, there is no evidence of pneumonia or congestive heart failure.  2. Decreased responsiveness, likely secondary to his respiratory failure. I cannot exclude entirely a stroke. However, his CT scan of the head was negative and right now while I am dictating, he started to move his upper and lower extremities and trying to pull the endotracheal tube out.  3. Chronic obstructive pulmonary disease with extensive pulmonary fibrosis.  4. Urinary tract infection with significant pyuria, recently treated with ciprofloxacin and the patient is recultured. That is, we sent another urine culture.  5. Elevated troponin. The patient had evaluation for this a few days ago seen by cardiology, Dr. Gwen PoundsKowalski, and decided no further evaluation or treatment and considered secondary to demand ischemia.  6. History of hypertension.  7. History of orthostatic hypotension.  8. History of seizure disorder.  9. History of hypothyroidism.  10. Diabetes mellitus, type II. 11. Degenerative arthritis. 12. History of sick sinus syndrome status post pacemaker insertion.  13. History of atrial fibrillation.  14. History of diastolic congestive heart failure with previous echocardiogram in May 2012 showing ejection fraction of 50 to 55%.  15. History of iron deficiency anemia.  16. History of instability and tendency to fall.   PLAN: The patient right now is intubated and connected to ventilator. We will continue ventilatory support. Intensify treatment with DuoNebs around-the-clock  and I will add IV Solu-Medrol. Zosyn antibiotic was started in the Emergency Department to cover for urinary tract infection. I will continue that since it has broad-spectrum coverage to rule out sepsis as culprit for the problem. Blood cultures and urine culture were taken. I will obtain CTA of the chest to ensure there is no pulmonary embolism. Continue home medications and use the NG tube for administration.   TIME NEEDED TO EVALUATE THIS PATIENT: More than 55 minutes.    ____________________________ Carney CornersAmir M. Rudene Rearwish, MD amd:ap D: 04/12/2011 07:08:43 ET T: 04/12/2011 08:53:05 ET JOB#: [id number]  cc: Carney CornersAmir M. Rudene Rearwish, MD, <Dictator> Marisue IvanKanhka Linthavong, MD Karolee OhsAMIR Dala DockM Arsen Mangione MD ELECTRONICALLY SIGNED 04/13/2011 3:48

## 2014-06-11 NOTE — Discharge Summary (Signed)
PATIENT NAME:  Jeff CatholicDOVER, Elishah E MR#:  [id number] DATE OF BIRTH:  05/08/1921  DATE OF ADMISSION:  04/12/2011 DATE OF DISCHARGE:  04/17/2011  DISCHARGE DIAGNOSES:  1. Acute on chronic respiratory failure requiring intubation.  2. History of chronic obstructive pulmonary disease stage IV with underlying pulmonary fibrosis.  3. Asymptomatic bacteria.  4. History of seizure disorder.  5. History of hypothyroidism.  6. History of sick sinus syndrome with pacemaker.  7. History of atrial fibrillation with pacemaker.  8. Depression.  9. Generalized weakness.   DISCHARGE MEDICATIONS: Per Dr. Harvie JuniorPhifer and hospice regimen.   PERTINENT LABS PRIOR TO DISCHARGE: Sodium 150, potassium 3.6, creatinine 0.93, white blood cell count 4.6, hemoglobin 11.5, platelets 143. Urinalysis was 3+. Blood cultures were no growth to date. Repeat urine culture was negative. The patient failed a modified barium swallow study. Head CT was negative. Chest x-ray consistent with chronic obstructive pulmonary disease. No infiltrates. Troponins mildly elevated at 0.12, 0.14 and 0.11.   BRIEF HOSPITAL COURSE:  1. Acute on chronic respiratory failure. The patient initially came in unresponsive and unable to protect his airway. He was intubated, placed on a vent, sent to the Critical Care Unit for close observation and management. He had underlying chronic obstructive pulmonary disease with pulmonary fibrosis. The reason for his acute respiratory failure is still unclear at this time. Chest x-ray was negative for acute infiltrate. CT of the chest was negative for PE. No signs of pulmonary edema. His UDS did test positive for benzos. It is possible that it could be suppression also. The patient failed his modified barium swallow study. Therefore, his respiratory failure could be contributed to aspiration. He is at increased risk for this in the future. This has been explained to him and he will continue with his diet with the known risk. He  has been seen by Dr. Harvie JuniorPhifer who has discussed hospice care with the patient and he is agreeable to the hospice home at this time. He is a DNR/DNI.  2. Other chronic medical issues. No further treatment at this time. Will focus more on Comfort Care through the hospice home. Management from now on to be administered through Dr. Harvie JuniorPhifer.   DISPOSITION: He is in a suitable state to be discharged to the hospice home per the care of Dr. Harvie JuniorPhifer.   ____________________________ Marisue IvanKanhka Alphonsus Doyel, MD kl:ap D: 04/17/2011 12:30:19 ET T: 04/17/2011 12:38:28 ET JOB#: [id number]  cc: Marisue IvanKanhka Buena Boehm, MD, <Dictator> Ned GraceNancy Phifer, MD Marisue IvanKANHKA Pasqualina Colasurdo MD ELECTRONICALLY SIGNED 04/23/2011 13:35

## 2014-06-11 NOTE — Discharge Summary (Signed)
PATIENT NAME:  Jeff Krause, Jeff Krause MR#:  [id number] DATE OF BIRTH:  Jul 08, 1921  DATE OF ADMISSION:  04/09/2011 DATE OF DISCHARGE:  04/11/2011   DISCHARGE DIAGNOSES:  1. Respiratory distress, resolving.  2. Stage IV chronic obstructive pulmonary disease with pulmonary fibrosis.  3. Hypothyroidism.  4. History of seizure disorder.  5. Borderline diabetes.  6. History of atrial fibrillation with pacemaker, not a candidate for anticoagulation.  7. History of sick sinus syndrome with pacemaker.  8. Depression.  9. History of instability with high fall risk.  10. Iron deficiency anemia.  11. Parkinsonism.  12. Asymptomatic bacteriuria.  13. Hypokalemia.   DISCHARGE MEDICATIONS:  1. Proventil 90 mcg 2 puffs q.4 hours as needed.  2. Carbamazepine 200 mg p.o. b.i.d.  3. Ferrous sulfate 325 mg p.o. b.i.d.  4. Fludrocortisone 0.1 mg p.o. daily.  5. Acetaminophen 650 mg p.o. q.6 hours p.r.n. for pain.  6. Lasix 20 mg p.o. daily.  7. Advair 250/50 1 puff b.i.d.  8. Spiriva 18 mcg 1 inhalation daily.  9. Synthroid 100 mcg p.o. daily.  10. Paxil 20 mg 1.5 tabs at bedtime.  11. Daliresp 500 mcg p.o. daily.  12. Cipro 500 mg p.o. b.i.d. x6 more days.  13. Potassium chloride 20 mEq p.o. b.i.d.  14. Magnesium oxide 400 mg p.o. daily.   CONSULT: Cardiology per Dr. Gwen PoundsKowalski    PROCEDURES: None.   PERTINENT LABS UPON DISCHARGE: Sodium 148, potassium 2.9, magnesium 1.5, creatinine 0.87. Lower extremity Doppler's were negative for DVT. Troponin was mildly elevated at 0.19. TSH 2.83. LDL 75.   BRIEF HOSPITAL COURSE: This is an 81 year old male who was admitted with acute respiratory distress.  1. Acute respiratory distress. The patient initially came in complaining of worsening shortness of breath with underlying chronic dyspnea with underlying COPD stage IV, and pulmonary fibrosis. Upon admission chest x-ray was negative for any acute process. His shortness of breath is likely multifactorial due to the  pulmonary fibrosis and COPD. No infectious source was found. He was continued on his home medications and he is back to his baseline state and will need continuous oxygen at home with 2 liters. He may need follow-up with Dr. Meredeth IdeFleming as outpatient if symptoms worsen.  2. Asymptomatic bacteriuria. The patient was noted to have abnormal urinalysis. He had pyuria but the patient had no symptoms at all. Urine culture is still pending. He is being treated with Cipro 500 mg. Will complete a seven-day course given his age and being a male.  3. Hypokalemia. This is new. Upon discharge potassium was found to be 2.9 and magnesium of 1.5. He is being repleted for both of those orally.  4. Other chronic medical issues remained stable. He was evaluated by Dr. Gwen PoundsKowalski due to a mildly elevated troponin at 0.18 which has contributed to demand ischemia. No further work-up at this time.   DISPOSITION: He is going to be discharged to home. He will need assistance at home but no therapy. He was evaluated by physical therapy here and did not recommend further therapy.  FOLLOW-UP: He needs follow-up in 1 to 2 weeks with Dr. Burnadette PopLinthavong.   ____________________________ Marisue IvanKanhka Annsley Akkerman, MD kl:drc D: 04/11/2011 08:09:50 ET T: 04/11/2011 09:54:16 ET JOB#: [id number]  cc: Marisue IvanKanhka Caryl Fate, MD, <Dictator> Marisue IvanKANHKA Treina Arscott MD ELECTRONICALLY SIGNED 04/14/2011 12:28

## 2014-06-11 NOTE — Op Note (Signed)
PATIENT NAME:  Jeff Krause, Josua E MR#:  [id number] DATE OF BIRTH:  11-05-1921  DATE OF PROCEDURE:  04/12/2011  PREOPERATIVE DIAGNOSIS: Respiratory failure, need for central venous access.   POSTOPERATIVE DIAGNOSIS: Respiratory failure, need for central venous access.   PROCEDURES PERFORMED: 1. Right femoral vein triple lumen catheter insertion.  2. Ultrasound guidance for placement of catheter.   DESCRIPTION OF PROCEDURE: With informed consent, with the patient supine, the right groin was sterilely prepped and draped utilizing ChloraPrep solution. A time out procedure was observed. 5 mL of 1% plain lidocaine was then infiltrated around the right groin. Hand-held ultrasound was used to confirm position and compressibility of the vein. On second pass, the needle was placed into the vein with passage of wire. The needle was removed. The skin incision was fashioned. 8 French percutaneous dilatation was then performed. The catheter had been previously flushed on the back table. It was inserted and secured at 20 cm. All three ports flushed and aspirated without difficulty and it was secured at the skin site at three points with silk suture. A sterile occlusive dressing was placed. The patient tolerated the procedure well without immediate complication. ____________________________ Redge GainerMark A. Egbert GaribaldiBird, MD mab:slb D: 04/12/2011 20:52:25 ET T: 04/13/2011 12:20:12 ET JOB#: [id number]  cc: Loraine LericheMark A. Egbert GaribaldiBird, MD, <Dictator> Lin Hackmann A Derryck Shahan MD ELECTRONICALLY SIGNED 04/15/2011 7:25

## 2014-06-11 NOTE — H&P (Signed)
PATIENT NAME:  Jeff Krause, Jeff Krause MR#:  [id number] DATE OF BIRTH:  04-04-1921  DATE OF ADMISSION:  04/09/2011  REFERRING PHYSICIAN: Dr. Glenetta HewMcLaurin    PRIMARY CARE PHYSICIAN: Dr. Burnadette PopLinthavong    PRIMARY CARDIOLOGIST: Dr. Lady GaryFath   PRIMARY PULMONOLOGIST: Dr. Meredeth IdeFleming    PRESENTING COMPLAINT: Shortness of breath, dizziness.   HISTORY OF PRESENT ILLNESS: Mr. Jeff Krause is a pleasant 81 year old gentleman with history of chronic obstructive pulmonary disease and pulmonary fibrosis on chronic oxygen followed by Dr. Meredeth IdeFleming, hypothyroidism, seizure disorder, hypertension, diabetes, iron deficiency anemia, and recent evaluation for probable Parkinson's disease who presents from home with complaints of developing worsening shortness of breath from his baseline and associated with dizziness. Denies any syncope. He initially had relayed that he was weak but the patient currently denies saying that he was weak. He denies any chest pain. No palpitations. Currently he is symptom free and now back to his baseline shortness of breath. On laboratory work-up the patient was found to have an elevated troponin of 0.15.   PAST MEDICAL HISTORY:  1. Chronic obstructive pulmonary disease and pulmonary fibrosis on chronic oxygen. The patient reports 4.5 liters, followed by Dr. Meredeth IdeFleming.  2. Hypothyroidism.  3. Degenerative arthritis.  4. Seizure disorder.  5. Hypertension.  6. Diabetes.  7. Hyperlipidemia.  8. Depression.  9. Orthostatic hypotension.  10. Sick sinus syndrome status post pacemaker placement and status post generator exchange in April of 2012.  11. Iron deficiency anemia.  12. Atrial fibrillation, not on anticoagulation due to fall risk.  13. Instability and fall risk.  14. Recent evaluation by Dr. Sherryll BurgerShah on 03/14/2011, diagnosed with probable Parkinson's.   15. Diastolic dysfunction CHF. The patient was admitted in May 2012 with pulmonary edema. Echocardiogram showed EF of 50 to 55%.   PAST SURGICAL HISTORY:   1. Abdominal surgery for bleeding peptic ulcer.  2. Pacemaker placement and generator exchange.  3. Inguinal hernia repair.  4. TURP.   ALLERGIES: Codeine, Dilantin, and aspirin.   MEDICATIONS:  1. Advair Diskus 250/50 one puff b.i.d.  2. Carbamazepine 200 mg b.i.d.  3. Tylenol Arthritis 650 mg every six hours as needed.  4. Lasix 20 mg daily.  5. Proventil HFA 2 puffs every four hours as needed.  6. Paxil 30 mg at bedtime.  7. Daliresp 500 mcg daily.  8. Iron sulfate 325 mg b.i.d.  9. Spiriva 1 capsule daily.  10. Synthroid 100 mcg daily.  11. Fludrocortisone 0.1 mg daily.  SOCIAL HISTORY: He lives at home with his girlfriend of 19 years. No alcohol or drug use. He quit tobacco in 1960.   FAMILY HISTORY: Mother died of stroke. Father had prostate cancer.   REVIEW OF SYSTEMS: CONSTITUTIONAL: No fevers, nausea, or vomiting. EYES: No changes in vision. ENT: No epistaxis or discharge. RESPIRATORY: He reports worsening shortness of breath from baseline. No hemoptysis. No wheezing. CARDIOVASCULAR: No chest pain. He reports lower extremity swelling for the past two weeks, left greater than right. No palpitations or syncope. GI: No nausea or vomiting. Reports sometimes bowel incontinence and loose stool. No abdominal pain, hematemesis, or melena. GU: No dysuria or hematuria. ENDOCRINE: No polyuria or polydipsia. HEME: He has easy bruising. SKIN: The patient reports sitting in one position for an extended period of time and has a sore on his bottom. MUSCULOSKELETAL: He reports pain in his fourth right finger. NEUROLOGIC: Denies feeling weak although initially was saying he was weak. He has history of seizure. PSYCH: He endorses depression but no suicidal  ideation.   PHYSICAL EXAMINATION:   VITAL SIGNS: Temperature 97.4, pulse 60, respiratory rate 18, blood pressure 153/80, sating at 100% on 3 liters.   GENERAL: Lying in bed in no apparent distress.   HEENT: Normocephalic, atraumatic. Pupils  equal, symmetric, nonicteric. Nasal cannula in place. He has slightly dry mucous membrane.   NECK: Soft and supple. No adenopathy. No JVP appreciated.   CARDIOVASCULAR: Non-tachy. No murmurs.   LUNGS: Faint wheezing diffusely. No use of accessory muscles or increased respiratory effort. The patient is with barrel chest.  EXTREMITIES: 3+ pitting edema of the left and 1 to 2+ pitting edema of the right. Dorsal pedis pulses faint.   MUSCULOSKELETAL: He has edema and ecchymosis of his fourth digit on the right with decreased mobility. He has noted joint deformity of the left second and fifth digits.   NEUROLOGIC: He has symmetrical strength. I cannot appreciate a significant intention tremor.   PERTINENT LABS AND STUDIES: Chest x-ray without any acute findings. WBC 5.7, hemoglobin 11.1, hematocrit 34.2, platelets 140, MCV 101, glucose 114, BUN 14, creatinine 0.72, sodium 149, potassium 3.3, chloride 101, carbon dioxide 38, calcium 8.4, total bilirubin 0.4, alkaline phosphatase 132, ALT 30, AST 39, total protein 6.2, albumin 3.1. Troponin 0.15. PTT 32.4. INR 1.1. EKG with rate of 61, prolonged QT and left bundle. No evidence of paced rhythm.   ASSESSMENT AND PLAN: Jeff Krause is an 81 year old man with history of chronic obstructive pulmonary disease, pulmonary fibrosis on chronic oxygen, hypothyroidism, hypertension, atrial fibrillation, diastolic dysfunction CHF, sick sinus syndrome status post pacemaker, seizure, orthostatic hypotension presenting with shortness of breath and dizziness. 1. Elevated troponin, likely demand ischemia. The patient is with underlying COPD and pulmonary fibrosis, atrial fibrillation, diastolic dysfunction CHF. Continue on tele. Cycle cardiac enzymes. Obtain Cardiology consultation. Send TSH, fasting lipid panel, A1c. The patient has allergy to aspirin. Will start low dose Coreg. Restart Spiriva, Advair, O2, Daliresp. Will start on SVNs as needed. Lower extremity edema  possibly in the setting of CHF but concern for clot with asymmetry and immobility. Will get lower extremity Doppler's to further evaluate. He just had his Lasix filled today. Will start IV Lasix b.i.d. Continue I's and O's and daily weights. Echocardiogram as above with normal EF.  2. Orthostatic hypertension. Restart fludrocortisone. Continue to monitor with starting on Coreg. Follow his orthostatics.  3. Thrombocytopenia, new, unclear etiology. Continue to follow.  4. Instability and falls with probable Parkinson's and recent evaluation by Dr. Sherryll Burger. He has had resultant injury in his fourth finger. Will go ahead and get an x-ray. May need a splint. He is also recommended to stop driving but the patient still endorses driving. Will get PT and OT evaluation. 5. Stage I sacral decub. Continue with frequent turns and decubitus precautions.  6. Seizure disorder. Restart his carbamazepine. Seizure precautions.  7. Hypokalemia. Start on supplements with increase in his potassium.  8. Diabetes. Sliding scale insulin.  9. Hypothyroidism. Restart his Synthroid. Send TSH.   TIME SPENT: Approximately 50 minutes spent on patient care.   ____________________________ Reuel Derby, MD ap:drc D: 04/09/2011 23:58:37 ET T: 04/10/2011 07:04:12 ET JOB#: [id number]  cc: Pearlean Brownie Nader Boys, MD, <Dictator> Marisue Ivan, MD Reuel Derby MD ELECTRONICALLY SIGNED 04/15/2011 1:17

## 2014-06-11 NOTE — Consult Note (Signed)
    Comments   I spoke with pt's daughter, Owens Lofflerat Crutchfield, to clarify goals of medical therapy. She does not want pt re-intubated in the event of respiratory failure. She also does not want pressors restarted as she knows this would be futile if pt is not to be re-intubated. Orders entered. Will add morphine prn for dyspnea and lorazepam for anxiety.   Electronic Signatures: Nieves Barberi, Harriett SineNancy (MD)  (Signed [id number]-Feb-13 16:56)  Authored: Palliative Care   Last Updated: 25-Feb-13 16:56 by Sayge Brienza, Harriett SineNancy (MD)
